# Patient Record
Sex: Male | Born: 1960 | Hispanic: No | Marital: Married | Smoking: Never smoker
Health system: Southern US, Community
[De-identification: ages and names within clinical notes are randomized; demographics above are authoritative.]

## PROBLEM LIST (undated history)

## (undated) DIAGNOSIS — Z87442 Personal history of urinary calculi: Secondary | ICD-10-CM

## (undated) DIAGNOSIS — K219 Gastro-esophageal reflux disease without esophagitis: Secondary | ICD-10-CM

## (undated) DIAGNOSIS — M1990 Unspecified osteoarthritis, unspecified site: Secondary | ICD-10-CM

## (undated) DIAGNOSIS — K5190 Ulcerative colitis, unspecified, without complications: Secondary | ICD-10-CM

## (undated) DIAGNOSIS — Z8719 Personal history of other diseases of the digestive system: Secondary | ICD-10-CM

## (undated) HISTORY — PX: APPENDECTOMY: SHX54

## (undated) HISTORY — PX: JOINT REPLACEMENT: SHX530

## (undated) HISTORY — PX: WISDOM TOOTH EXTRACTION: SHX21

## (undated) HISTORY — PX: OTHER SURGICAL HISTORY: SHX169

---

## 2019-08-02 NOTE — Patient Instructions (Addendum)
DUE TO COVID-19 ONLY ONE VISITOR IS ALLOWED TO COME WITH YOU AND STAY IN THE WAITING ROOM ONLY DURING PRE OP AND PROCEDURE DAY OF SURGERY. THE 1 VISITOR MAY VISIT WITH YOU AFTER SURGERY IN YOUR PRIVATE ROOM DURING VISITING HOURS ONLY!  YOU NEED TO HAVE A COVID 19 TEST ON_______ @_______ , THIS TEST MUST BE DONE BEFORE SURGERY, COME  [address], [place] , .  [facility]) ONCE YOUR COVID TEST IS COMPLETED, PLEASE BEGIN THE QUARANTINE INSTRUCTIONS AS OUTLINED IN YOUR HANDOUT.                Joe Moody  08/02/2019   Your procedure is scheduled on: 08-09-19   Report to [facility] Main  Entrance   Report to admitting at       0930  AM     Call this number if you have problems the morning of surgery [phone]    Remember: NO SOLID FOOD AFTER MIDNIGHT THE NIGHT PRIOR TO SURGERY. NOTHING BY MOUTH EXCEPT CLEAR LIQUIDS UNTIL    0900 am . PLEASE FINISH ENSURE DRINK PER SURGEON ORDER  WHICH NEEDS TO BE COMPLETED AT    0900 am then nothing by mouth .     BRUSH YOUR TEETH MORNING OF SURGERY AND RINSE YOUR MOUTH OUT, NO CHEWING GUM CANDY OR MINTS.     Take these medicines the morning of surgery with A SIP OF WATER: mesalamine                                 You may not have any metal on your body including hair pins and              piercings  Do not wear jewelry,  lotions, powders or perfumes, deodorant              Men may shave face and neck.   Do not bring valuables to the hospital. [facility] IS NOT             RESPONSIBLE   FOR VALUABLES.  Contacts, dentures or bridgework may not be worn into surgery.  .              Please read over the following fact sheets you were given: ____________________________________________________________________           [facility] - Preparing for Surgery Before surgery, you can play an important role.  Because skin is not sterile, your skin needs to be as free of germs as possible.  You can reduce the  number of germs on your skin by washing with CHG (chlorahexidine gluconate) soap before surgery.  CHG is an antiseptic cleaner which kills germs and bonds with the skin to continue killing germs even after washing. Please DO NOT use if you have an allergy to CHG or antibacterial soaps.  If your skin becomes reddened/irritated stop using the CHG and inform your nurse when you arrive at Short Stay. Do not shave (including legs and underarms) for at least 48 hours prior to the first CHG shower.  You may shave your face/neck. Please follow these instructions carefully:  1.  Shower with CHG Soap the night before surgery and the  morning of Surgery.  2.  If you choose to wash your hair, wash your hair first as usual with your  normal  shampoo.  3.  After you shampoo, rinse your hair and  body thoroughly to remove the  shampoo.                           4.  Use CHG as you would any other liquid soap.  You can apply chg directly  to the skin and wash                       Gently with a scrungie or clean washcloth.  5.  Apply the CHG Soap to your body ONLY FROM THE NECK DOWN.   Do not use on face/ open                           Wound or open sores. Avoid contact with eyes, ears mouth and genitals (private parts).                       Wash face,  Genitals (private parts) with your normal soap.             6.  Wash thoroughly, paying special attention to the area where your surgery  will be performed.  7.  Thoroughly rinse your body with warm water from the neck down.  8.  DO NOT shower/wash with your normal soap after using and rinsing off  the CHG Soap.                9.  Pat yourself dry with a clean towel.            10.  Wear clean pajamas.            11.  Place clean sheets on your bed the night of your first shower and do not  sleep with pets. Day of Surgery : Do not apply any lotions/deodorants the morning of surgery.  Please wear clean clothes to the hospital/surgery center.  FAILURE TO FOLLOW  THESE INSTRUCTIONS MAY RESULT IN THE CANCELLATION OF YOUR SURGERY PATIENT SIGNATURE_________________________________  NURSE SIGNATURE__________________________________  ________________________________________________________________________   [name]  An incentive spirometer is a tool that can help keep your lungs clear and active. This tool measures how well you are filling your lungs with each breath. Taking long deep breaths may help reverse or decrease the chance of developing breathing (pulmonary) problems (especially infection) following:  A long period of time when you are unable to move or be active. BEFORE THE PROCEDURE   If the spirometer includes an indicator to show your best effort, your nurse or respiratory therapist will set it to a desired goal.  If possible, sit up straight or lean slightly forward. Try not to slouch.  Hold the incentive spirometer in an upright position. INSTRUCTIONS FOR USE  1. Sit on the edge of your bed if possible, or sit up as far as you can in bed or on a chair. 2. Hold the incentive spirometer in an upright position. 3. Breathe out normally. 4. Place the mouthpiece in your mouth and seal your lips tightly around it. 5. Breathe in slowly and as deeply as possible, raising the piston or the ball toward the top of the column. 6. Hold your breath for 3-5 seconds or for as long as possible. Allow the piston or ball to fall to the bottom of the column. 7. Remove the mouthpiece from your mouth and breathe out normally. 8. Rest for a few seconds and repeat Steps 1 through  7 at least 10 times every 1-2 hours when you are awake. Take your time and take a few normal breaths between deep breaths. 9. The spirometer may include an indicator to show your best effort. Use the indicator as a goal to work toward during each repetition. 10. After each set of 10 deep breaths, practice coughing to be sure your lungs are clear. If you have an incision  (the cut made at the time of surgery), support your incision when coughing by placing a pillow or rolled up towels firmly against it. Once you are able to get out of bed, walk around indoors and cough well. You may stop using the incentive spirometer when instructed by your caregiver.  RISKS AND COMPLICATIONS  Take your time so you do not get dizzy or light-headed.  If you are in pain, you may need to take or ask for pain medication before doing incentive spirometry. It is harder to take a deep breath if you are having pain. AFTER USE  Rest and breathe slowly and easily.  It can be helpful to keep track of a log of your progress. Your caregiver can provide you with a simple table to help with this. If you are using the spirometer at home, follow these instructions: [place] IF:   You are having difficultly using the spirometer.  You have trouble using the spirometer as often as instructed.  Your pain medication is not giving enough relief while using the spirometer.  You develop fever of 100.5 F (38.1 C) or higher. SEEK IMMEDIATE MEDICAL CARE IF:   You cough up bloody sputum that had not been present before.  You develop fever of 102 F (38.9 C) or greater.  You develop worsening pain at or near the incision site. MAKE SURE YOU:   Understand these instructions.  Will watch your condition.  Will get help right away if you are not doing well or get worse. Document Released: 02/21/2007 Document Revised: 01/03/2012 Document Reviewed: 04/24/2007 ExitCare Patient Information 2014 ExitCare, [place].   ________________________________________________________________________  WHAT IS A BLOOD TRANSFUSION? Blood Transfusion Information  A transfusion is the replacement of blood or some of its parts. Blood is made up of multiple cells which provide different functions.  Red blood cells carry oxygen and are used for blood loss replacement.  White blood cells fight against  infection.  Platelets control bleeding.  Plasma helps clot blood.  Other blood products are available for specialized needs, such as hemophilia or other clotting disorders. BEFORE THE TRANSFUSION  Who gives blood for transfusions?   Healthy volunteers who are fully evaluated to make sure their blood is safe. This is blood bank blood. Transfusion therapy is the safest it has ever been in the practice of medicine. Before blood is taken from a donor, a complete history is taken to make sure that person has no history of diseases nor engages in risky social behavior (examples are intravenous drug use or sexual activity with multiple partners). The donor's travel history is screened to minimize risk of transmitting infections, such as malaria. The donated blood is tested for signs of infectious diseases, such as HIV and hepatitis. The blood is then tested to be sure it is compatible with you in order to minimize the chance of a transfusion reaction. If you or a relative donates blood, this is often done in anticipation of surgery and is not appropriate for emergency situations. It takes many days to process the donated blood. RISKS AND COMPLICATIONS  Although transfusion therapy is very safe and saves many lives, the main dangers of transfusion include:   Getting an infectious disease.  Developing a transfusion reaction. This is an allergic reaction to something in the blood you were given. Every precaution is taken to prevent this. The decision to have a blood transfusion has been considered carefully by your caregiver before blood is given. Blood is not given unless the benefits outweigh the risks. AFTER THE TRANSFUSION  Right after receiving a blood transfusion, you will usually feel much better and more energetic. This is especially true if your red blood cells have gotten low (anemic). The transfusion raises the level of the red blood cells which carry oxygen, and this usually causes an energy  increase.  The nurse administering the transfusion will monitor you carefully for complications. HOME CARE INSTRUCTIONS  No special instructions are needed after a transfusion. You may find your energy is better. Speak with your caregiver about any limitations on activity for underlying diseases you may have. SEEK MEDICAL CARE IF:   Your condition is not improving after your transfusion.  You develop redness or irritation at the intravenous (IV) site. SEEK IMMEDIATE MEDICAL CARE IF:  Any of the following symptoms occur over the next 12 hours:  Shaking chills.  You have a temperature by mouth above 102 F (38.9 C), not controlled by medicine.  Chest, back, or muscle pain.  People around you feel you are not acting correctly or are confused.  Shortness of breath or difficulty breathing.  Dizziness and fainting.  You get a rash or develop hives.  You have a decrease in urine output.  Your urine turns a dark color or changes to pink, red, or brown. Any of the following symptoms occur over the next 10 days:  You have a temperature by mouth above 102 F (38.9 C), not controlled by medicine.  Shortness of breath.  Weakness after normal activity.  The white part of the eye turns yellow (jaundice).  You have a decrease in the amount of urine or are urinating less often.  Your urine turns a dark color or changes to pink, red, or brown. Document Released: 10/08/2000 Document Revised: 01/03/2012 Document Reviewed: 05/27/2008 [facility] Patient Information 2014 [place], [place].  _______________________________________________________________________

## 2019-08-06 ENCOUNTER — Other Ambulatory Visit (HOSPITAL_COMMUNITY): Payer: BC Managed Care – PPO

## 2019-08-06 ENCOUNTER — Other Ambulatory Visit (HOSPITAL_COMMUNITY)
Admission: RE | Admit: 2019-08-06 | Discharge: 2019-08-06 | Disposition: A | Discharge status: Home / Self Care | Payer: BC Managed Care – PPO | Source: Ambulatory Visit | Attending: Orthopedic Surgery | Admitting: Orthopedic Surgery

## 2019-08-06 ENCOUNTER — Encounter (HOSPITAL_COMMUNITY): Payer: Self-pay

## 2019-08-06 ENCOUNTER — Other Ambulatory Visit: Payer: Self-pay

## 2019-08-06 ENCOUNTER — Encounter (INDEPENDENT_AMBULATORY_CARE_PROVIDER_SITE_OTHER): Payer: Self-pay

## 2019-08-06 ENCOUNTER — Encounter (HOSPITAL_COMMUNITY)
Admission: RE | Admit: 2019-08-06 | Discharge: 2019-08-06 | Disposition: A | Discharge status: Home / Self Care | Payer: BC Managed Care – PPO | Source: Ambulatory Visit | Attending: Orthopedic Surgery | Admitting: Orthopedic Surgery

## 2019-08-06 DIAGNOSIS — T84091A Other mechanical complication of internal left hip prosthesis, initial encounter: Secondary | ICD-10-CM | POA: Diagnosis not present

## 2019-08-06 DIAGNOSIS — Z20828 Contact with and (suspected) exposure to other viral communicable diseases: Secondary | ICD-10-CM | POA: Insufficient documentation

## 2019-08-06 DIAGNOSIS — Z01812 Encounter for preprocedural laboratory examination: Secondary | ICD-10-CM | POA: Diagnosis not present

## 2019-08-06 HISTORY — DX: Ulcerative colitis, unspecified, without complications: K51.90

## 2019-08-06 HISTORY — DX: Personal history of urinary calculi: Z87.442

## 2019-08-06 HISTORY — DX: Personal history of other diseases of the digestive system: Z87.19

## 2019-08-06 HISTORY — DX: Unspecified osteoarthritis, unspecified site: M19.90

## 2019-08-06 HISTORY — DX: Gastro-esophageal reflux disease without esophagitis: K21.9

## 2019-08-06 LAB — SURGICAL PCR SCREEN
MRSA, PCR: NEGATIVE
Staphylococcus aureus: NEGATIVE

## 2019-08-06 LAB — ABO/RH: ABO/RH(D): O POS

## 2019-08-06 NOTE — Progress Notes (Signed)
PCP - Laverta Baltimore PA-C Cardiologist -   Chest x-ray -  EKG - 07-31-19 EPIC Stress Test -  ECHO -  Cardiac Cath -  CBC/DIFF, CMP 07-31-19 ON CHART  Sleep Study -  CPAP -   Fasting Blood Sugar -  Checks Blood Sugar _____ times a day  Blood Thinner Instructions: Aspirin Instructions: Last Dose:  Anesthesia review:   Patient denies shortness of breath, fever, cough and chest pain at PAT appointment   Patient verbalized understanding of instructions that were given to them at the PAT appointment. Patient was also instructed that they will need to review over the PAT instructions again at home before surgery.

## 2019-08-06 NOTE — H&P (Signed)
TOTAL HIP REVISION ADMISSION H&P  Patient is admitted for left revision total hip arthroplasty.  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 male, has a history of pain and functional disability in the left hip due to dislocations and patient has failed non-surgical conservative treatments for greater than 12 weeks to include NSAID's and/or analgesics, use of assistive devices and activity modification. The indications for the revision total hip arthroplasty are instability with 3 dislocations.  Onset of symptoms was abrupt starting 3 years ago with stable course since that time.  Prior procedures on the left hip include arthroplasty and and subsquent revision.  Patient currently rates pain in the left hip at 10 out of 10 with dislocations, otherwise no significant pain.  Patient has evidence of previous TH by imaging studies.  This condition presents safety issues increasing the risk of falls.    There is no current active infection.  Risks, benefits and expectations were discussed with the patient.  Risks including but not limited to the risk of anesthesia, blood clots, nerve damage, blood vessel damage, failure of the prosthesis, infection and up to and including death.  Patient understand the risks, benefits and expectations and wishes to proceed with surgery.    PCP: Gweneth Fritter, MD  D/C Plans:       Home  Post-op Meds:       No Rx given   Tranexamic Acid:      To be given - IV   Decadron:      Is to be given  FYI:      ASA  Norco  DME:   Pt equipment Rx sent  PT:   HEP  Pharmacy: [facility], [place]   Past Medical History:  Diagnosis Date  . Arthritis   . GERD (gastroesophageal reflux disease)   . History of kidney stones   . Status post dilation of esophageal narrowing   . Ulcerative colitis [facility])     Past Surgical History:  Procedure Laterality Date  . APPENDECTOMY    . arthroscopic knees     bil    . JOINT REPLACEMENT     bil hips  . WISDOM TOOTH EXTRACTION      No current facility-administered medications for this encounter.    Current Outpatient Medications  Medication Sig Dispense Refill Last Dose  . celecoxib (CELEBREX) 200 MG capsule Take 200 mg by mouth every evening.     Marland Kitchen esomeprazole (NEXIUM) 40 MG capsule Take 40 mg by mouth every evening.     . finasteride (PROSCAR) 5 MG tablet Take 5 mg by mouth every evening.     . mesalamine (LIALDA) 1.2 g EC tablet Take 2.4 g by mouth daily with breakfast.     . Probiotic Product (ALIGN) 4 MG CAPS Take 1 capsule by mouth daily.      No Known Allergies  Social History   Tobacco Use  . Smoking status: Never Smoker  . Smokeless tobacco: Never Used  Substance Use Topics  . Alcohol use: Not Currently       Review of Systems  Constitutional: Negative.   HENT: Negative.   Eyes: Negative.   Respiratory: Negative.   Cardiovascular: Negative.   Gastrointestinal: Positive for heartburn.  Genitourinary: Negative.   Musculoskeletal: Positive for joint pain.  Skin: Negative.   Neurological: Negative.   Endo/Heme/Allergies: Negative.   Psychiatric/Behavioral: Negative.     Objective:  Physical Exam  Constitutional:  He is oriented to person, place, and time. He appears well-developed.  HENT:  Head: Normocephalic.  Eyes: Pupils are equal, round, and reactive to light.  Neck: Neck supple. No JVD present. No tracheal deviation present. No thyromegaly present.  Cardiovascular: Normal rate, regular rhythm and intact distal pulses.  Respiratory: Effort normal and breath sounds normal. No respiratory distress. He has no wheezes.  GI: Soft. There is no abdominal tenderness. There is no guarding.  Musculoskeletal:     Left hip: He exhibits tenderness. He exhibits no laceration (healed previous incision).  Lymphadenopathy:    He has no cervical adenopathy.  Neurological: He is alert and oriented to person, place, and time. A  sensory deficit (tingling LEs (back)) is present.  Skin: Skin is warm and dry.  Psychiatric: He has a normal mood and affect.    Vital signs in last 24 hours: Temp:  [98.3 F (36.8 C)] 98.3 F (36.8 C) (10/12 1307) Pulse Rate:  [57] 57 (10/12 1307) Resp:  [18] 18 (10/12 1307) BP: (129)/(74) 129/74 (10/12 1307) SpO2:  [100 %] 100 % (10/12 1307) Weight:  [81 kg] 81 kg (10/12 1307)   Labs:   Estimated body mass index is 24.9 kg/m as calculated from the following:   Height as of 08/06/19: 5\' 11"  (1.803 m).   Weight as of 08/06/19: 81 kg.  Imaging Review:  Plain radiographs demonstrate THA of the left hip. The bone quality appears to be good for age and reported activity level.    Assessment/Plan:  Left hip with failed previous arthroplasty.  The patient history, physical examination, clinical judgement of the provider and imaging studies are consistent with failure of the left hip, previous total hip arthroplasty. Revision total hip arthroplasty is deemed medically necessary. The treatment options including medical management, injection therapy, arthroscopy and arthroplasty were discussed at length. The risks and benefits of total hip arthroplasty were presented and reviewed. The risks due to aseptic loosening, infection, stiffness, dislocation/subluxation,  thromboembolic complications and other imponderables were discussed.  The patient acknowledged the explanation, agreed to proceed with the plan and consent was signed. Patient is being admitted for inpatient treatment for surgery, pain control, PT, OT, prophylactic antibiotics, VTE prophylaxis, progressive ambulation and ADL's and discharge planning. The patient is planning to be discharged home.    10/06/19 Jaylaa Gallion   PA-C  08/06/2019, 8:34 PM

## 2019-08-07 LAB — SARS CORONAVIRUS 2 (TAT 6-24 HRS): SARS Coronavirus 2: NEGATIVE

## 2019-08-09 ENCOUNTER — Encounter (HOSPITAL_COMMUNITY): Payer: Self-pay | Admitting: Registered Nurse

## 2019-08-09 ENCOUNTER — Inpatient Hospital Stay (HOSPITAL_COMMUNITY)
Admission: RE | Admit: 2019-08-09 | Discharge: 2019-08-10 | DRG: 468 | Disposition: A | Discharge status: Home / Self Care | Payer: BC Managed Care – PPO | Attending: Orthopedic Surgery | Admitting: Orthopedic Surgery

## 2019-08-09 ENCOUNTER — Inpatient Hospital Stay (HOSPITAL_COMMUNITY): Payer: BC Managed Care – PPO | Admitting: Registered Nurse

## 2019-08-09 ENCOUNTER — Encounter (HOSPITAL_COMMUNITY)
Admission: RE | Disposition: A | Discharge status: Home / Self Care | Payer: Self-pay | Source: Home / Self Care | Attending: Orthopedic Surgery

## 2019-08-09 ENCOUNTER — Inpatient Hospital Stay (HOSPITAL_COMMUNITY): Payer: BC Managed Care – PPO

## 2019-08-09 ENCOUNTER — Other Ambulatory Visit: Payer: Self-pay

## 2019-08-09 ENCOUNTER — Inpatient Hospital Stay (HOSPITAL_COMMUNITY): Payer: BC Managed Care – PPO | Admitting: Physician Assistant

## 2019-08-09 DIAGNOSIS — Z96641 Presence of right artificial hip joint: Secondary | ICD-10-CM | POA: Diagnosis present

## 2019-08-09 DIAGNOSIS — T84021A Dislocation of internal left hip prosthesis, initial encounter: Secondary | ICD-10-CM | POA: Diagnosis present

## 2019-08-09 DIAGNOSIS — K219 Gastro-esophageal reflux disease without esophagitis: Secondary | ICD-10-CM | POA: Diagnosis present

## 2019-08-09 DIAGNOSIS — Z79899 Other long term (current) drug therapy: Secondary | ICD-10-CM

## 2019-08-09 DIAGNOSIS — Z87442 Personal history of urinary calculi: Secondary | ICD-10-CM

## 2019-08-09 DIAGNOSIS — M25352 Other instability, left hip: Secondary | ICD-10-CM | POA: Diagnosis present

## 2019-08-09 DIAGNOSIS — Z96649 Presence of unspecified artificial hip joint: Secondary | ICD-10-CM

## 2019-08-09 DIAGNOSIS — M1990 Unspecified osteoarthritis, unspecified site: Secondary | ICD-10-CM | POA: Diagnosis present

## 2019-08-09 HISTORY — PX: TOTAL HIP REVISION: SHX763

## 2019-08-09 LAB — TYPE AND SCREEN
ABO/RH(D): O POS
Antibody Screen: NEGATIVE

## 2019-08-09 SURGERY — TOTAL HIP REVISION
Anesthesia: Spinal | Site: Hip | Laterality: Left

## 2019-08-09 MED ORDER — ONDANSETRON HCL 4 MG/2ML IJ SOLN: 4.0000 mg | Freq: Four times a day (QID) | INTRAMUSCULAR | Status: DC | PRN

## 2019-08-09 MED ORDER — MENTHOL 3 MG MT LOZG: 1.0000 | LOZENGE | OROMUCOSAL | Status: DC | PRN

## 2019-08-09 MED ORDER — FINASTERIDE 5 MG PO TABS
5.0000 mg | ORAL_TABLET | Freq: Every evening | ORAL | Status: DC
  Administered 2019-08-09: 5 mg via ORAL
  Filled 2019-08-09: qty 1

## 2019-08-09 MED ORDER — MIDAZOLAM HCL 5 MG/5ML IJ SOLN
INTRAMUSCULAR | Status: DC | PRN
  Administered 2019-08-09 (×2): 1 mg via INTRAVENOUS

## 2019-08-09 MED ORDER — CEFAZOLIN SODIUM-DEXTROSE 2-4 GM/100ML-% IV SOLN
2.0000 g | INTRAVENOUS | Status: AC
  Administered 2019-08-09: 12:00:00 2 g via INTRAVENOUS
  Filled 2019-08-09: qty 100

## 2019-08-09 MED ORDER — OXYCODONE HCL 5 MG PO TABS: 5.0000 mg | ORAL_TABLET | Freq: Once | ORAL | Status: DC | PRN

## 2019-08-09 MED ORDER — MAGNESIUM CITRATE PO SOLN: 1.0000 | Freq: Once | ORAL | Status: DC | PRN

## 2019-08-09 MED ORDER — FENTANYL CITRATE (PF) 100 MCG/2ML IJ SOLN
INTRAMUSCULAR | Status: DC | PRN
  Administered 2019-08-09 (×2): 50 ug via INTRAVENOUS

## 2019-08-09 MED ORDER — ACETAMINOPHEN 325 MG PO TABS: 325.0000 mg | ORAL_TABLET | Freq: Four times a day (QID) | ORAL | Status: DC | PRN

## 2019-08-09 MED ORDER — TRANEXAMIC ACID-NACL 1000-0.7 MG/100ML-% IV SOLN
1000.0000 mg | Freq: Once | INTRAVENOUS | Status: AC
  Administered 2019-08-09: 1000 mg via INTRAVENOUS
  Filled 2019-08-09: qty 100

## 2019-08-09 MED ORDER — PROPOFOL 10 MG/ML IV BOLUS
INTRAVENOUS | Status: DC | PRN
  Administered 2019-08-09: 30 mg via INTRAVENOUS

## 2019-08-09 MED ORDER — SODIUM CHLORIDE 0.9 % IV SOLN
INTRAVENOUS | Status: DC
  Administered 2019-08-09: 17:00:00 via INTRAVENOUS

## 2019-08-09 MED ORDER — PROMETHAZINE HCL 25 MG/ML IJ SOLN: 6.2500 mg | INTRAMUSCULAR | Status: DC | PRN

## 2019-08-09 MED ORDER — ALBUMIN HUMAN 5 % IV SOLN
INTRAVENOUS | Status: DC | PRN
  Administered 2019-08-09 (×2): via INTRAVENOUS

## 2019-08-09 MED ORDER — ALBUMIN HUMAN 5 % IV SOLN
INTRAVENOUS | Status: AC
  Filled 2019-08-09: qty 500

## 2019-08-09 MED ORDER — ALUM & MAG HYDROXIDE-SIMETH 200-200-20 MG/5ML PO SUSP: 15.0000 mL | ORAL | Status: DC | PRN

## 2019-08-09 MED ORDER — DIPHENHYDRAMINE HCL 12.5 MG/5ML PO ELIX: 12.5000 mg | ORAL_SOLUTION | ORAL | Status: DC | PRN

## 2019-08-09 MED ORDER — CHLORHEXIDINE GLUCONATE 4 % EX LIQD: 60.0000 mL | Freq: Once | CUTANEOUS | Status: DC

## 2019-08-09 MED ORDER — DOCUSATE SODIUM 100 MG PO CAPS
100.0000 mg | ORAL_CAPSULE | Freq: Two times a day (BID) | ORAL | Status: DC
  Administered 2019-08-09 – 2019-08-10 (×2): 100 mg via ORAL
  Filled 2019-08-09 (×2): qty 1

## 2019-08-09 MED ORDER — ONDANSETRON HCL 4 MG PO TABS: 4.0000 mg | ORAL_TABLET | Freq: Four times a day (QID) | ORAL | Status: DC | PRN

## 2019-08-09 MED ORDER — METHOCARBAMOL 500 MG IVPB - SIMPLE MED
500.0000 mg | Freq: Four times a day (QID) | INTRAVENOUS | Status: DC | PRN
  Administered 2019-08-09: 500 mg via INTRAVENOUS
  Filled 2019-08-09: qty 50

## 2019-08-09 MED ORDER — CEFAZOLIN SODIUM-DEXTROSE 2-4 GM/100ML-% IV SOLN
2.0000 g | Freq: Four times a day (QID) | INTRAVENOUS | Status: AC
  Administered 2019-08-09 (×2): 2 g via INTRAVENOUS
  Filled 2019-08-09 (×2): qty 100

## 2019-08-09 MED ORDER — MIDAZOLAM HCL 2 MG/2ML IJ SOLN
INTRAMUSCULAR | Status: AC
  Filled 2019-08-09: qty 2

## 2019-08-09 MED ORDER — HYDROCODONE-ACETAMINOPHEN 7.5-325 MG PO TABS: 1.0000 | ORAL_TABLET | ORAL | Status: DC | PRN

## 2019-08-09 MED ORDER — FENTANYL CITRATE (PF) 100 MCG/2ML IJ SOLN
INTRAMUSCULAR | Status: AC
  Filled 2019-08-09: qty 2

## 2019-08-09 MED ORDER — DEXAMETHASONE SODIUM PHOSPHATE 10 MG/ML IJ SOLN
INTRAMUSCULAR | Status: AC
  Filled 2019-08-09: qty 1

## 2019-08-09 MED ORDER — TRANEXAMIC ACID-NACL 1000-0.7 MG/100ML-% IV SOLN
1000.0000 mg | INTRAVENOUS | Status: AC
  Administered 2019-08-09: 12:00:00 1000 mg via INTRAVENOUS
  Filled 2019-08-09: qty 100

## 2019-08-09 MED ORDER — HYDROMORPHONE HCL 1 MG/ML IJ SOLN
0.2500 mg | INTRAMUSCULAR | Status: DC | PRN
  Administered 2019-08-09 (×3): 0.5 mg via INTRAVENOUS

## 2019-08-09 MED ORDER — PHENYLEPHRINE 40 MCG/ML (10ML) SYRINGE FOR IV PUSH (FOR BLOOD PRESSURE SUPPORT)
PREFILLED_SYRINGE | INTRAVENOUS | Status: DC | PRN
  Administered 2019-08-09: 80 ug via INTRAVENOUS

## 2019-08-09 MED ORDER — DEXAMETHASONE SODIUM PHOSPHATE 10 MG/ML IJ SOLN
10.0000 mg | Freq: Once | INTRAMUSCULAR | Status: AC
  Administered 2019-08-09: 10 mg via INTRAVENOUS

## 2019-08-09 MED ORDER — SODIUM CHLORIDE 0.9 % IR SOLN
Status: DC | PRN
  Administered 2019-08-09: 1000 mL

## 2019-08-09 MED ORDER — HYDROMORPHONE HCL 1 MG/ML IJ SOLN
0.5000 mg | INTRAMUSCULAR | Status: DC | PRN
  Administered 2019-08-09: 1 mg via INTRAVENOUS
  Filled 2019-08-09: qty 1

## 2019-08-09 MED ORDER — METOCLOPRAMIDE HCL 5 MG/ML IJ SOLN: 5.0000 mg | Freq: Three times a day (TID) | INTRAMUSCULAR | Status: DC | PRN

## 2019-08-09 MED ORDER — HYDROMORPHONE HCL 1 MG/ML IJ SOLN
INTRAMUSCULAR | Status: AC
  Filled 2019-08-09: qty 1

## 2019-08-09 MED ORDER — ESOMEPRAZOLE MAGNESIUM 20 MG PO CPDR
40.0000 mg | DELAYED_RELEASE_CAPSULE | Freq: Every day | ORAL | Status: DC
  Administered 2019-08-09: 40 mg via ORAL
  Filled 2019-08-09: qty 2

## 2019-08-09 MED ORDER — PHENYLEPHRINE 40 MCG/ML (10ML) SYRINGE FOR IV PUSH (FOR BLOOD PRESSURE SUPPORT)
PREFILLED_SYRINGE | INTRAVENOUS | Status: AC
  Filled 2019-08-09: qty 10

## 2019-08-09 MED ORDER — CELECOXIB 200 MG PO CAPS
200.0000 mg | ORAL_CAPSULE | Freq: Two times a day (BID) | ORAL | Status: DC
  Administered 2019-08-09 – 2019-08-10 (×2): 200 mg via ORAL
  Filled 2019-08-09 (×2): qty 1

## 2019-08-09 MED ORDER — FERROUS SULFATE 325 (65 FE) MG PO TABS
325.0000 mg | ORAL_TABLET | Freq: Three times a day (TID) | ORAL | Status: DC
  Administered 2019-08-09 – 2019-08-10 (×2): 325 mg via ORAL
  Filled 2019-08-09 (×2): qty 1

## 2019-08-09 MED ORDER — ONDANSETRON HCL 4 MG/2ML IJ SOLN
INTRAMUSCULAR | Status: DC | PRN
  Administered 2019-08-09: 4 mg via INTRAVENOUS

## 2019-08-09 MED ORDER — PHENOL 1.4 % MT LIQD: 1.0000 | OROMUCOSAL | Status: DC | PRN

## 2019-08-09 MED ORDER — METHOCARBAMOL 500 MG PO TABS
500.0000 mg | ORAL_TABLET | Freq: Four times a day (QID) | ORAL | Status: DC | PRN
  Filled 2019-08-09: qty 1

## 2019-08-09 MED ORDER — METOCLOPRAMIDE HCL 5 MG PO TABS: 5.0000 mg | ORAL_TABLET | Freq: Three times a day (TID) | ORAL | Status: DC | PRN

## 2019-08-09 MED ORDER — OXYCODONE HCL 5 MG/5ML PO SOLN: 5.0000 mg | Freq: Once | ORAL | Status: DC | PRN

## 2019-08-09 MED ORDER — HYDROCODONE-ACETAMINOPHEN 5-325 MG PO TABS
1.0000 | ORAL_TABLET | ORAL | Status: DC | PRN
  Administered 2019-08-09 – 2019-08-10 (×4): 2 via ORAL
  Filled 2019-08-09 (×4): qty 2

## 2019-08-09 MED ORDER — LIDOCAINE 2% (20 MG/ML) 5 ML SYRINGE
INTRAMUSCULAR | Status: AC
  Filled 2019-08-09: qty 5

## 2019-08-09 MED ORDER — PROPOFOL 10 MG/ML IV BOLUS
INTRAVENOUS | Status: AC
  Filled 2019-08-09: qty 20

## 2019-08-09 MED ORDER — LACTATED RINGERS IV SOLN
INTRAVENOUS | Status: DC
  Administered 2019-08-09 (×3): via INTRAVENOUS

## 2019-08-09 MED ORDER — ONDANSETRON HCL 4 MG/2ML IJ SOLN
INTRAMUSCULAR | Status: AC
  Filled 2019-08-09: qty 2

## 2019-08-09 MED ORDER — ASPIRIN 81 MG PO CHEW
81.0000 mg | CHEWABLE_TABLET | Freq: Two times a day (BID) | ORAL | Status: DC
  Administered 2019-08-09 – 2019-08-10 (×2): 81 mg via ORAL
  Filled 2019-08-09 (×2): qty 1

## 2019-08-09 MED ORDER — BUPIVACAINE IN DEXTROSE 0.75-8.25 % IT SOLN
INTRATHECAL | Status: DC | PRN
  Administered 2019-08-09: 2 mL via INTRATHECAL

## 2019-08-09 MED ORDER — BISACODYL 10 MG RE SUPP: 10.0000 mg | Freq: Every day | RECTAL | Status: DC | PRN

## 2019-08-09 MED ORDER — LIDOCAINE 2% (20 MG/ML) 5 ML SYRINGE
INTRAMUSCULAR | Status: DC | PRN
  Administered 2019-08-09: 40 mg via INTRAVENOUS

## 2019-08-09 MED ORDER — METHOCARBAMOL 500 MG IVPB - SIMPLE MED
INTRAVENOUS | Status: AC
  Filled 2019-08-09: qty 50

## 2019-08-09 MED ORDER — DEXAMETHASONE SODIUM PHOSPHATE 10 MG/ML IJ SOLN: 10.0000 mg | Freq: Once | INTRAMUSCULAR | Status: DC

## 2019-08-09 MED ORDER — POLYETHYLENE GLYCOL 3350 17 G PO PACK
17.0000 g | PACK | Freq: Two times a day (BID) | ORAL | Status: DC
  Administered 2019-08-09 – 2019-08-10 (×2): 17 g via ORAL
  Filled 2019-08-09 (×2): qty 1

## 2019-08-09 MED ORDER — PROPOFOL 500 MG/50ML IV EMUL
INTRAVENOUS | Status: DC | PRN
  Administered 2019-08-09: 75 ug/kg/min via INTRAVENOUS

## 2019-08-09 SURGICAL SUPPLY — 69 items
BAG DECANTER FOR FLEXI CONT (MISCELLANEOUS) ×3 IMPLANT
BAG ZIPLOCK 12X15 (MISCELLANEOUS) ×3 IMPLANT
BLADE SAW SGTL 11.0X1.19X90.0M (BLADE) IMPLANT
BLADE SAW SGTL 18X1.27X75 (BLADE) ×2 IMPLANT
BLADE SAW SGTL 18X1.27X75MM (BLADE) ×1
BRUSH FEMORAL CANAL (MISCELLANEOUS) IMPLANT
COVER SURGICAL LIGHT HANDLE (MISCELLANEOUS) ×3 IMPLANT
COVER WAND RF STERILE (DRAPES) IMPLANT
CUP ACET PINNCLE SECTR II 60MM (Hips) ×1 IMPLANT
DERMABOND ADVANCED (GAUZE/BANDAGES/DRESSINGS) ×4
DERMABOND ADVANCED .7 DNX12 (GAUZE/BANDAGES/DRESSINGS) ×2 IMPLANT
DRAPE ORTHO SPLIT 77X108 STRL (DRAPES) ×4
DRAPE POUCH INSTRU U-SHP 10X18 (DRAPES) ×3 IMPLANT
DRAPE SURG 17X11 SM STRL (DRAPES) ×3 IMPLANT
DRAPE SURG ORHT 6 SPLT 77X108 (DRAPES) ×2 IMPLANT
DRAPE U-SHAPE 47X51 STRL (DRAPES) ×3 IMPLANT
DRESSING AQUACEL AG SP 3.5X10 (GAUZE/BANDAGES/DRESSINGS) IMPLANT
DRSG AQUACEL AG ADV 3.5X10 (GAUZE/BANDAGES/DRESSINGS) IMPLANT
DRSG AQUACEL AG ADV 3.5X14 (GAUZE/BANDAGES/DRESSINGS) ×3 IMPLANT
DRSG AQUACEL AG SP 3.5X10 (GAUZE/BANDAGES/DRESSINGS)
DURAPREP 26ML APPLICATOR (WOUND CARE) ×3 IMPLANT
ELECT BLADE TIP CTD 4 INCH (ELECTRODE) ×3 IMPLANT
ELECT REM PT RETURN 15FT ADLT (MISCELLANEOUS) ×3 IMPLANT
ELIMINATOR HOLE APEX DEPUY (Hips) ×3 IMPLANT
FACESHIELD WRAPAROUND (MASK) ×12 IMPLANT
GAUZE SPONGE 2X2 8PLY STRL LF (GAUZE/BANDAGES/DRESSINGS) ×1 IMPLANT
GLOVE BIOGEL M 7.0 STRL (GLOVE) IMPLANT
GLOVE BIOGEL PI IND STRL 7.5 (GLOVE) ×1 IMPLANT
GLOVE BIOGEL PI IND STRL 8.5 (GLOVE) ×1 IMPLANT
GLOVE BIOGEL PI INDICATOR 7.5 (GLOVE) ×2
GLOVE BIOGEL PI INDICATOR 8.5 (GLOVE) ×2
GLOVE ECLIPSE 8.0 STRL XLNG CF (GLOVE) ×6 IMPLANT
GOWN STRL REUS W/TWL 2XL LVL3 (GOWN DISPOSABLE) ×3 IMPLANT
GOWN STRL REUS W/TWL LRG LVL3 (GOWN DISPOSABLE) ×3 IMPLANT
HANDPIECE INTERPULSE COAX TIP (DISPOSABLE)
HEAD FEMUR METAL 36MM 15.6 HIP (Head) ×1 IMPLANT
KIT TURNOVER KIT A (KITS) IMPLANT
LINER NEUTRAL 58X36MM PLUS4 ×3 IMPLANT
MANIFOLD NEPTUNE II (INSTRUMENTS) ×3 IMPLANT
MARKER SKIN DUAL TIP RULER LAB (MISCELLANEOUS) ×3 IMPLANT
METAL FEMUR HEAD 36MM 15.6 HIP (Head) ×3 IMPLANT
NDL SAFETY ECLIPSE 18X1.5 (NEEDLE) ×1 IMPLANT
NEEDLE HYPO 18GX1.5 SHARP (NEEDLE) ×2
NS IRRIG 1000ML POUR BTL (IV SOLUTION) ×6 IMPLANT
PACK TOTAL JOINT (CUSTOM PROCEDURE TRAY) ×3 IMPLANT
PADDING CAST COTTON 6X4 STRL (CAST SUPPLIES) ×3 IMPLANT
PINNACLE SECTOR II CUP 60MM (Hips) ×3 IMPLANT
PRESSURIZER FEMORAL UNIV (MISCELLANEOUS) IMPLANT
PROTECTOR NERVE ULNAR (MISCELLANEOUS) ×3 IMPLANT
SCREW 6.5MMX30MM (Screw) ×3 IMPLANT
SCREW 6.5MMX35MM (Screw) ×3 IMPLANT
SET HNDPC FAN SPRY TIP SCT (DISPOSABLE) IMPLANT
SPONGE GAUZE 2X2 STER 10/PKG (GAUZE/BANDAGES/DRESSINGS) ×2
SPONGE LAP 18X18 RF (DISPOSABLE) ×3 IMPLANT
SPONGE LAP 4X18 RFD (DISPOSABLE) ×3 IMPLANT
STAPLER VISISTAT 35W (STAPLE) ×3 IMPLANT
SUCTION FRAZIER HANDLE 10FR (MISCELLANEOUS) ×2
SUCTION TUBE FRAZIER 10FR DISP (MISCELLANEOUS) ×1 IMPLANT
SUT MNCRL AB 3-0 PS2 18 (SUTURE) ×3 IMPLANT
SUT STRATAFIX PDS+ 0 24IN (SUTURE) ×3 IMPLANT
SUT VIC AB 1 CT1 36 (SUTURE) ×3 IMPLANT
SUT VIC AB 2-0 CT1 27 (SUTURE) ×6
SUT VIC AB 2-0 CT1 TAPERPNT 27 (SUTURE) ×3 IMPLANT
TOWEL OR 17X26 10 PK STRL BLUE (TOWEL DISPOSABLE) ×6 IMPLANT
TOWER CARTRIDGE SMART MIX (DISPOSABLE) IMPLANT
TRAY FOLEY MTR SLVR 16FR STAT (SET/KITS/TRAYS/PACK) ×3 IMPLANT
TUBE KAMVAC SUCTION (TUBING) IMPLANT
WATER STERILE IRR 1000ML POUR (IV SOLUTION) ×3 IMPLANT
YANKAUER SUCT BULB TIP 10FT TU (MISCELLANEOUS) ×3 IMPLANT

## 2019-08-09 NOTE — Anesthesia Preprocedure Evaluation (Signed)
Anesthesia Evaluation  Patient identified by MRN, date of birth, ID band Patient awake    Reviewed: Allergy & Precautions, NPO status , Patient's Chart, lab work & pertinent test results  Airway Mallampati: II  TM Distance: >3 FB Neck ROM: Full    Dental no notable dental hx.    Pulmonary neg pulmonary ROS,    Pulmonary exam normal breath sounds clear to auscultation       Cardiovascular negative cardio ROS Normal cardiovascular exam Rhythm:Regular Rate:Normal     Neuro/Psych negative neurological ROS  negative psych ROS   GI/Hepatic Neg liver ROS, GERD  ,  Endo/Other  negative endocrine ROS  Renal/GU negative Renal ROS  negative genitourinary   Musculoskeletal  (+) Arthritis , Osteoarthritis,    Abdominal   Peds negative pediatric ROS (+)  Hematology negative hematology ROS (+)   Anesthesia Other Findings   Reproductive/Obstetrics negative OB ROS                             Anesthesia Physical Anesthesia Plan  ASA: II  Anesthesia Plan: Spinal   Post-op Pain Management:    Induction: Intravenous  PONV Risk Score and Plan: 1 and Ondansetron and Treatment may vary due to age or medical condition  Airway Management Planned: Simple Face Mask  Additional Equipment:   Intra-op Plan:   Post-operative Plan:   Informed Consent: I have reviewed the patients History and Physical, chart, labs and discussed the procedure including the risks, benefits and alternatives for the proposed anesthesia with the patient or authorized representative who has indicated his/her understanding and acceptance.     Dental advisory given  Plan Discussed with: CRNA  Anesthesia Plan Comments:         Anesthesia Quick Evaluation

## 2019-08-09 NOTE — Discharge Instructions (Signed)

## 2019-08-09 NOTE — Transfer of Care (Signed)
Immediate Anesthesia Transfer of Care Note  Patient: Joe Moody  Procedure(s) Performed: TOTAL HIP REVISION (Left Hip)  Patient Location: PACU  Anesthesia Type:Spinal  Level of Consciousness: sedated  Airway & Oxygen Therapy: Patient Spontanous Breathing and Patient connected to face mask oxygen  Post-op Assessment: Report given to RN and Post -op Vital signs reviewed and stable  Post vital signs: Reviewed and stable  Last Vitals:  Vitals Value Taken Time  BP 103/70 08/09/19 1408  Temp    Pulse 60 08/09/19 1409  Resp 10 08/09/19 1409  SpO2 100 % 08/09/19 1409  Vitals shown include unvalidated device data.  Last Pain:  Vitals:   08/09/19 0944  TempSrc: Oral      Patients Stated Pain Goal: 4 (29/24/46 2863)  Complications: No apparent anesthesia complications

## 2019-08-09 NOTE — Anesthesia Procedure Notes (Signed)
Spinal  Patient location during procedure: OR Start time: 08/09/2019 11:59 AM End time: 08/09/2019 12:05 PM Staffing Anesthesiologist: Lynda Rainwater, MD Resident/CRNA: Talbot Grumbling, CRNA Preanesthetic Checklist Completed: patient identified, surgical consent, pre-op evaluation, timeout performed, IV checked, risks and benefits discussed and monitors and equipment checked Spinal Block Patient position: sitting Prep: DuraPrep Patient monitoring: heart rate, cardiac monitor, continuous pulse ox and blood pressure Approach: midline Location: L3-4 Injection technique: single-shot Needle Needle type: Pencan  Needle gauge: 24 G Needle length: 9 cm Assessment Sensory level: T4 Additional Notes Clear CSF, no paresthesia, patient tolerated well.

## 2019-08-09 NOTE — Anesthesia Procedure Notes (Signed)
Date/Time: 08/09/2019 12:09 PM Performed by: Talbot Grumbling, CRNA Oxygen Delivery Method: Simple face mask

## 2019-08-09 NOTE — Interval H&P Note (Signed)
History and Physical Interval Note:  08/09/2019 10:25 AM  Joe Moody  has presented today for surgery, with the diagnosis of Left total hip instability.  The various methods of treatment have been discussed with the patient and family. After consideration of risks, benefits and other options for treatment, the patient has consented to  Procedure(s) with comments: TOTAL HIP REVISION (Left) - 90 mins as a surgical intervention.  The patient's history has been reviewed, patient examined, no change in status, stable for surgery.  I have reviewed the patient's chart and labs.  Questions were answered to the patient's satisfaction.     Mauri Pole

## 2019-08-09 NOTE — Evaluation (Signed)
Physical Therapy Evaluation Patient Details Name: Joe Moody MRN: [id number] DOB: Dec 17, 1960 Today's Date: 08/09/2019   History of Present Illness  Patient is 58 y.o. male s/p Lt THR (revision) on 08/09/19 with PMH significnat for bil GERD and OA with bil hip replacements and multiple revisions on Lt.    Clinical Impression  Joe Moody is a 58 y.o. male POD 0 s/p Lt THR . Patient reports independence with mobility at baseline. Patient is now limited by functional impairments (see PT problem list below) and requires min gurad for transfers and gait with RW. Patient was able to ambulate ~120 feet with RW and demonstrated good safety awareness. Patient instructed in exercise to facilitate circulation to prevent secondary medical complications. Extensive time spent educating patient on benefits of OPPT and typical course of rehab for THA. Discussed importance of graded exercises with one-on-one therapy sessions to gradually and safely progress mobility towards LTG of independent exercise routine within limits of his surgeons precautions/restrictions. Patient will benefit from continued skilled PT interventions to address impairments and progress towards PLOF. Acute PT will follow to progress mobility and stair training in preparation for safe discharge home.     Follow Up Recommendations Outpatient PT;Follow surgeon's recommendation for DC plan and follow-up therapies    Equipment Recommendations  None recommended by PT    Recommendations for Other Services       Precautions / Restrictions Precautions Precautions: Fall;Posterior Hip Restrictions Weight Bearing Restrictions: No      Mobility  Bed Mobility Overal bed mobility: Needs Assistance Bed Mobility: Supine to Sit     Supine to sit: Supervision;HOB elevated     General bed mobility comments: pt maintained hip precautions and no cues or assistance required  Transfers Overall transfer level: Needs assistance Equipment  used: Rolling walker (2 wheeled) Transfers: Sit to/from Stand Sit to Stand: From elevated surface;Min guard         General transfer comment: cues for safe hand placement with RW and safe technique, pt steady with rising, guard for safety  Ambulation/Gait Ambulation/Gait assistance: Min guard Gait Distance (Feet): 120 Feet Assistive device: Rolling walker (2 wheeled) Gait Pattern/deviations: Step-through pattern;Decreased step length - left;Decreased stance time - left;Decreased stride length Gait velocity: slighty decreased   General Gait Details: pt with good safety awareness throughout and no overt LOB noted, verbal cues to ensure small steps and educate pt not to pivot on Lt LE during turns  Science writer    Modified Rankin (Stroke Patients Only)       Balance Overall balance assessment: Mild deficits observed, not formally tested                  Pertinent Vitals/Pain Pain Assessment: 0-10 Pain Score: 2  Pain Location: Lt hip Pain Descriptors / Indicators: Aching;Sore Pain Intervention(s): Monitored during session;Limited activity within patient's tolerance;Repositioned    Home Living Family/patient expects to be discharged to:: Private residence Living Arrangements: Spouse/significant other Available Help at Discharge: Family Type of Home: House Home Access: Stairs to enter Entrance Stairs-Rails: None Entrance Stairs-Number of Steps: 3 Home Layout: One level Home Equipment: Walker - 2 wheels;Crutches;Toilet riser;Shower seat - built in;Grab bars - tub/shower      Prior Function Level of Independence: Independent               Hand Dominance   Dominant Hand: Right    Extremity/Trunk Assessment   Upper Extremity Assessment Upper  Extremity Assessment: Overall WFL for tasks assessed    Lower Extremity Assessment Lower Extremity Assessment: Overall WFL for tasks assessed    Cervical / Trunk  Assessment Cervical / Trunk Assessment: Normal  Communication   Communication: HOH  Cognition Arousal/Alertness: Awake/alert Behavior During Therapy: WFL for tasks assessed/performed Overall Cognitive Status: Within Functional Limits for tasks assessed                   General Comments      Exercises Total Joint Exercises Ankle Circles/Pumps: AROM;10 reps;Seated;Both   Assessment/Plan    PT Assessment Patient needs continued PT services  PT Problem List Decreased strength;Decreased range of motion;Decreased mobility;Decreased activity tolerance       PT Treatment Interventions DME instruction;Functional mobility training;Balance training;Patient/family education;Modalities;Therapeutic activities;Gait training;Stair training;Therapeutic exercise    PT Goals (Current goals can be found in the Care Plan section)  Acute Rehab PT Goals Patient Stated Goal: to get back to exercising with machines at home, to not have hip dislocate again PT Goal Formulation: With patient Time For Goal Achievement: 08/16/19 Potential to Achieve Goals: Good    Frequency 7X/week    AM-PAC PT "6 Clicks" Mobility  Outcome Measure Help needed turning from your back to your side while in a flat bed without using bedrails?: A Little Help needed moving from lying on your back to sitting on the side of a flat bed without using bedrails?: A Little Help needed moving to and from a bed to a chair (including a wheelchair)?: A Little Help needed standing up from a chair using your arms (e.g., wheelchair or bedside chair)?: A Little Help needed to walk in hospital room?: A Little Help needed climbing 3-5 steps with a railing? : A Little 6 Click Score: 18    End of Session Equipment Utilized During Treatment: Gait belt Activity Tolerance: Patient tolerated treatment well Patient left: with call bell/phone within reach;with family/visitor present;in chair;with chair alarm set Nurse Communication:  Mobility status PT Visit Diagnosis: Muscle weakness (generalized) (M62.81);Difficulty in walking, not elsewhere classified (R26.2)    Time: 5956-3875 PT Time Calculation (min) (ACUTE ONLY): 46 min   Charges:   PT Evaluation $PT Eval Low Complexity: 1 Low PT Treatments $Self Care/Home Management: 8-22        Valentino Saxon, PT, DPT Physical Therapist with [facility]  08/09/2019 6:48 PM

## 2019-08-10 ENCOUNTER — Encounter (HOSPITAL_COMMUNITY): Payer: Self-pay | Admitting: Orthopedic Surgery

## 2019-08-10 LAB — BASIC METABOLIC PANEL
Anion gap: 8 (ref 5–15)
BUN: 14 mg/dL (ref 6–20)
CO2: 25 mmol/L (ref 22–32)
Calcium: 9 mg/dL (ref 8.9–10.3)
Chloride: 102 mmol/L (ref 98–111)
Creatinine, Ser: 0.87 mg/dL (ref 0.61–1.24)
GFR calc Af Amer: >60 mL/min (ref >60)
GFR calc non Af Amer: >60 mL/min (ref >60)
Glucose, Bld: 136 mg/dL — ABNORMAL HIGH (ref 70–99)
Potassium: 4.3 mmol/L (ref 3.5–5.1)
Sodium: 135 mmol/L (ref 135–145)

## 2019-08-10 LAB — CBC
HCT: 36.9 % — ABNORMAL LOW (ref 39.0–52.0)
Hemoglobin: 12.6 g/dL — ABNORMAL LOW (ref 13.0–17.0)
MCH: 31.3 pg (ref 26.0–34.0)
MCHC: 34.1 g/dL (ref 30.0–36.0)
MCV: 91.8 fL (ref 80.0–100.0)
Platelets: 148 10*3/uL — ABNORMAL LOW (ref 150–400)
RBC: 4.02 MIL/uL — ABNORMAL LOW (ref 4.22–5.81)
RDW: 11.8 % (ref 11.5–15.5)
WBC: 14.2 10*3/uL — ABNORMAL HIGH (ref 4.0–10.5)
nRBC: 0 % (ref 0.0–0.2)

## 2019-08-10 MED ORDER — POLYETHYLENE GLYCOL 3350 17 G PO PACK: 17.0000 g | PACK | Freq: Two times a day (BID) | ORAL | 0 refills | Status: AC

## 2019-08-10 MED ORDER — HYDROCODONE-ACETAMINOPHEN 5-325 MG PO TABS: 1.0000 | ORAL_TABLET | Freq: Four times a day (QID) | ORAL | 0 refills | Status: AC | PRN

## 2019-08-10 MED ORDER — DOCUSATE SODIUM 100 MG PO CAPS: 100.0000 mg | ORAL_CAPSULE | Freq: Two times a day (BID) | ORAL | 0 refills | Status: AC

## 2019-08-10 MED ORDER — METHOCARBAMOL 500 MG PO TABS: 500.0000 mg | ORAL_TABLET | Freq: Four times a day (QID) | ORAL | 0 refills | Status: AC | PRN

## 2019-08-10 MED ORDER — ASPIRIN 81 MG PO CHEW: 81.0000 mg | CHEWABLE_TABLET | Freq: Two times a day (BID) | ORAL | 0 refills | Status: AC

## 2019-08-10 MED ORDER — FERROUS SULFATE 325 (65 FE) MG PO TABS: 325.0000 mg | ORAL_TABLET | Freq: Three times a day (TID) | ORAL | 0 refills | Status: AC

## 2019-08-10 NOTE — Progress Notes (Signed)
Physical Therapy Treatment Patient Details Name: Joe Moody MRN: [id number] DOB: October 13, 1961 Today's Date: 08/10/2019    History of Present Illness Patient is 58 y.o. male s/p Lt THR (revision) on 08/09/19 with PMH significnat for bil GERD and OA with bil hip replacements and multiple revisions on Lt.    PT Comments    Pt progressing well; reviewed THP with handout; reviewed HEP handout; discussed THP with regard to car transfers. Ready for d/c from PT standpoint.   Follow Up Recommendations  Outpatient PT;Follow surgeon's recommendation for DC plan and follow-up therapies     Equipment Recommendations  None recommended by PT    Recommendations for Other Services       Precautions / Restrictions Precautions Precautions: Fall;Posterior Hip Restrictions Weight Bearing Restrictions: No    Mobility  Bed Mobility Overal bed mobility: Modified Independent                Transfers Overall transfer level: Needs assistance Equipment used: Rolling walker (2 wheeled) Transfers: Sit to/from Stand Sit to Stand: Supervision;Modified independent (Device/Increase time)         General transfer comment: cues for THP  Ambulation/Gait Ambulation/Gait assistance: Supervision Gait Distance (Feet): 380 Feet Assistive device: Rolling walker (2 wheeled) Gait Pattern/deviations: Step-through pattern     General Gait Details: cues for progression, THP with turns   Stairs             Wheelchair Mobility    Modified Rankin (Stroke Patients Only)       Balance                                            Cognition Arousal/Alertness: Awake/alert Behavior During Therapy: WFL for tasks assessed/performed Overall Cognitive Status: Within Functional Limits for tasks assessed                                        Exercises Total Joint Exercises Ankle Circles/Pumps: AROM;10 reps;Seated;Both Long Arc Quad: AROM;Both;10 reps Knee  Flexion: AROM;Left;10 reps( reviewed HEP handout with pt)    General Comments        Pertinent Vitals/Pain Pain Assessment: 0-10 Pain Score: 1  Pain Location: Lt hip Pain Descriptors / Indicators: Aching;Sore Pain Intervention(s): Limited activity within patient's tolerance;Monitored during session;Premedicated before session;Repositioned    Home Living                      Prior Function            PT Goals (current goals can now be found in the care plan section) Acute Rehab PT Goals Patient Stated Goal: to get back to exercising with machines at home, to not have hip dislocate again PT Goal Formulation: With patient Time For Goal Achievement: 08/16/19 Potential to Achieve Goals: Good Progress towards PT goals: Progressing toward goals    Frequency    7X/week      PT Plan Current plan remains appropriate    Co-evaluation              AM-PAC PT "6 Clicks" Mobility   Outcome Measure  Help needed turning from your back to your side while in a flat bed without using bedrails?: A Little Help needed moving from lying on your back to sitting on the  side of a flat bed without using bedrails?: None Help needed moving to and from a bed to a chair (including a wheelchair)?: None Help needed standing up from a chair using your arms (e.g., wheelchair or bedside chair)?: None Help needed to walk in hospital room?: None Help needed climbing 3-5 steps with a railing? : A Little 6 Click Score: 22    End of Session   Activity Tolerance: Patient tolerated treatment well Patient left: in chair;with call bell/phone within reach;with family/visitor present Nurse Communication: Mobility status PT Visit Diagnosis: Muscle weakness (generalized) (M62.81);Difficulty in walking, not elsewhere classified (R26.2)     Time: 2979-8921 PT Time Calculation (min) (ACUTE ONLY): 24 min  Charges:  $Gait Training: 23-37 mins                     Kenyon Ana, PT  Pager:  [phone] Acute Rehab Dept [facility]): [phone]   08/10/2019    [facility] 08/10/2019, 10:36 AM

## 2019-08-10 NOTE — Plan of Care (Signed)
Patient discharged home in stable condition 

## 2019-08-10 NOTE — Brief Op Note (Signed)
08/09/2019  1:50PM  PATIENT:  Joe Moody  58 y.o. 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   Delay start of Pharmacological VTE agent (>24hrs) due to surgical blood loss or risk of bleeding: no

## 2019-08-10 NOTE — Progress Notes (Signed)
     Subjective: 1 Day Post-Op Procedure(s) (LRB): TOTAL HIP REVISION (Left)   Seen by Dr. Alvan Dame. Patient reports pain as mild, pain controlled.  No events throughout the night.  The procedure, findings and expectations moving forward per reviewed with the patient.  Posterior hip precautions. Patient to be discharged home today, if he does well with therapy and pain control.  Patient follow-up in the clinic in 2 weeks.  Patient is to call with any questions or concerns.   Objective:   VITALS:   Vitals:   08/10/19 0151 08/10/19 0553  BP: 104/61 109/68  Pulse: 71 69  Resp: 16 18  Temp: 98 F (36.7 C) 98.1 F (36.7 C)  SpO2: 99% 100%    Dorsiflexion/Plantar flexion intact Incision: dressing C/D/I No cellulitis present Compartment soft  LABS Recent Labs    08/10/19 0259  HGB 12.6*  HCT 36.9*  WBC 14.2*  PLT 148*    Recent Labs    08/10/19 0259  NA 135  K 4.3  BUN 14  CREATININE 0.87  GLUCOSE 136*     Assessment/Plan: 1 Day Post-Op Procedure(s) (LRB): TOTAL HIP REVISION (Left) Posterior hip precautions Foley cath d/c'ed Advance diet Up with therapy D/C IV fluids Discharge home Follow up in 2 weeks at [facility] Follow up with OLIN,Daja Shuping D in 2 weeks.  Contact information:  [facility] [address] [place]a   PAC  08/10/2019, 8:18 AM

## 2019-08-10 NOTE — Op Note (Signed)
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  PROCEDURE:  Revision left total hip replacement.  COMPONENTS USED:  DePuy size 60 mm Pinnacle shell with a 36+4 neutral ALTRX liner and a 36+15 Articul/Eze metal ball.  SURGEON:  Durene Romans, MD  ASSISTANT:  Lanney Gins, PA-C.  Note that Mr. Carmon Sails was present for the entirety of the case from preoperative positioning, perioperative management of the operative extremity, general facilitation of the case and primary wound closure.  ANESTHESIA:  Spinal.  BLOOD LOSS:  700 mL.  DRAINS:  None.  COMPLICATIONS:  None.  INDICATIONS FOR PROCEDURE:  The patient is a pleasant 58 year old male with history of left total hip replacement.  In fact, he had both hips replaced in the past.  He has had 2 episodes of instability or dislocation of the left hip.  The most recent one  required an open reduction.  He was seen in referral evaluation for management of the left hip.  In the office, we discussed observation with the idea of continued observation and conservative management.  He did not want to get through what he went  through before and decided to proceed with elective revision left total hip.  We reviewed the risks of recurrent and early dislocation following this type of surgery, the risk of infection, DVT, and need for future surgeries.  Consent was obtained for  benefit of pain relief as well as provide more security of the left lower extremity.  PROCEDURE IN DETAIL:  The patient was brought to the operative theater.  Once adequate anesthesia, preoperative antibiotics, and Ancef administered, he was positioned into the  right lateral decubitus position, left hip up.  The left lower extremity was  then prepped and draped in sterile fashion for posterior approach.  A timeout was performed identifying the patient, the planned procedure, and extremity.  His old incision had been marked.  I excised his old scar.  Soft tissue planes were created.   Posterior approach to the hip was carried out through the iliotibial band and gluteal fascia.  We encountered a mildly blood-tinged synovial fluid.  No signs of infection.  The posterior aspect of the hip had the appearance of the previous surgical  intervention with scarring of the synovium and surrounding scar tissue.  The posterior aspect of the hip was directly exposed with no overlying soft tissues.  With routine exposure of the posterior aspect of the hip carried out, I dislocated the femoral  head, and we removed the femoral head using a bone tamp.  I cleared off the superior ilium and placed the neck and trunnion onto the ilium and retracted the femur anteriorly.  This allowed for further exposure of the anterior acetabulum.  I evaluated the  position of the previously placed cup.  It appeared to be positioned at about 45 degrees of abduction, but maybe 10 degrees of anteversion.  The anterior rim of the acetabulum was not palpable.  Given these findings, we elected to proceed with primary  intent of an acetabular revision.  I used the Innomed explant system and was able to remove the acetabular shell.  There was evidence of very good bony ingrowth.  This took some effort; however, the cup was removed without significant bone  loss.  Once  this was carried out and the retractors placed for exposure, we irrigated the acetabulum and reamed with a 57 mm reamer as we removed a 58 mm cup.  I then reamed with a 59 mm and used a 60 mm cup.  I was able to place this cup with better anteversion  with the anterior rim of the acetabulum exposed, as well as posterior aspect of the  acetabular shell exposed.  Utilizing the same guide that I utilized for evaluation of this, I felt like the abduction was around 140 degrees and the forward flexion was  now about 25-30 degrees.  Given these findings, I placed 2 cancellous screws in the ilium and a trial 36+4 liner.  Then we did trial reductions.  The femoral head that had been removed was 36+8.5 mm.  For that reason, I tried the 12 mm and then the 15 mm  ball.  I found with the 15 mm ball, the combined anteversion of the hip and stem now were about 45-50 degrees, whereas it was much less than it was prior.  The leg lengths appeared to be improved with a 15 mm ball as compared to the 8.5 and 12 ball.   Given these findings, I selected a 36+15 Articul/Eze metal ball.  It was impacted on a clean and dried trunnion and the hip was reduced.  The hip was irrigated throughout the case and again at this point.  Again, there were no posterior tissues to  reapproximate at this point.  I reapproximated the iliotibial band and gluteal fascia using #1 Vicryl and a Stratafix suture.  The remaining wound was closed with 2-0 Vicryl and a running Monocryl.  The hip was cleaned, dried, and dressed sterilely using  surgical glue and an Aquacel dressing.  He was brought to the recovery room in stable condition, tolerating the procedure well.  LN/NUANCE  D:08/10/2019 T:08/10/2019 JOB:[id number]

## 2019-08-10 NOTE — Anesthesia Postprocedure Evaluation (Signed)
Anesthesia Post Note  Patient: Joe Moody  Procedure(s) Performed: TOTAL HIP REVISION (Left Hip)     Patient location during evaluation: PACU Anesthesia Type: Spinal Level of consciousness: oriented and awake and alert Pain management: pain level controlled Vital Signs Assessment: post-procedure vital signs reviewed and stable Respiratory status: spontaneous breathing and respiratory function stable Cardiovascular status: blood pressure returned to baseline and stable Postop Assessment: no headache, no backache and no apparent nausea or vomiting Anesthetic complications: no    Last Vitals:  Vitals:   08/10/19 0151 08/10/19 0553  BP: 104/61 109/68  Pulse: 71 69  Resp: 16 18  Temp: 36.7 C 36.7 C  SpO2: 99% 100%    Last Pain:  Vitals:   08/10/19 0600  TempSrc:   PainSc: 2                  Lynda Rainwater

## 2019-08-14 NOTE — Discharge Summary (Signed)
Physician Discharge Summary  Patient ID: Joe Moody MRN: [id number] DOB/AGE: January 12, 1961 58 y.o.  Admit date: 08/09/2019 Discharge date: 08/10/2019   Procedures:  Procedure(s) (LRB): TOTAL HIP REVISION (Left)  Attending Physician:  Dr. 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 Arthritis   . GERD (gastroesophageal reflux disease)   . History of kidney stones   . Status post dilation of esophageal narrowing   . Ulcerative colitis [facility](HCC)     HPI:    Joe Moody, 58 y.o. male, has a history of pain and functional disability in the left hip due to dislocations and patient has failed non-surgical conservative treatments for greater than 12 weeks to include NSAID's and/or analgesics, use of assistive devices and activity modification. The indications for the revision total hip arthroplasty are instability with 3 dislocations. Onset of symptoms was abrupt starting 3 years ago with stable course since that time.  Prior procedures on the left hip include arthroplasty and and subsquent revision. Patient currently rates pain in the left hip at 10 out of 10 with dislocations, otherwise no significant pain.  Patient has evidence of previous TH by imaging studies.  This condition presents safety issues increasing the risk of falls.   There is no current active infection.  Risks, benefits and expectations were discussed with the patient.  Risks including but not limited to the risk of anesthesia, blood clots, nerve damage, blood vessel damage, failure of the prosthesis, infection and up to and including death.  Patient understand the risks, benefits and expectations and wishes to proceed with surgery.   PCP: Nicholaus Bloomrisp, Laddie Moore, MD   Discharged Condition: good  Hospital Course:  Patient underwent the above stated procedure on 08/09/2019.  Patient tolerated the procedure well and brought to the recovery room in good condition and subsequently to the floor.  POD #1 BP: 109/68 ; Pulse: 69 ; Temp: 98.1 F (36.7 C) ; Resp: 18 Patient reports pain as mild, pain controlled.  No events throughout the night.  The procedure, findings and expectations moving forward per reviewed with the patient.  Posterior hip precautions. Patient to be discharged home. Dorsiflexion/plantar flexion intact, incision: dressing C/D/I, no cellulitis present and compartment soft.   LABS  Basename    HGB     12.6  HCT     36.9    Discharge Exam: General appearance: alert, cooperative and no distress Extremities: Homans sign is negative, no sign of DVT, no edema, redness or tenderness in the calves or thighs and no ulcers, gangrene or trophic changes  Disposition:  Home with follow up in 2 weeks   Follow-up Information    Durene Romanslin, Trease Bremner, MD. Schedule an appointment as soon as possible for a visit in 2 weeks.   Specialty: Orthopedic Surgery Contact information: [address] [place] [id number] [phone]           Discharge Instructions    Call MD / Call 911   Complete by: As directed    If you experience chest pain or shortness of breath, CALL 911 and be transported to the hospital emergency room.  If you develope a fever above 101 F, pus (white drainage) or increased drainage or redness at the wound, or calf pain, call your surgeon's office.   Change dressing   Complete by: As directed  Maintain surgical dressing until follow up in the clinic. If the edges start to pull up, may reinforce with tape. If the dressing is no longer working, may remove and cover with gauze and tape, but must keep the area dry and clean.  Call with any questions or concerns.   Constipation Prevention   Complete by: As directed    Drink plenty of fluids.  Prune juice may be helpful.  You may use a stool softener, such as Colace (over the  counter) 100 mg twice a day.  Use MiraLax (over the counter) for constipation as needed.   Diet - low sodium heart healthy   Complete by: As directed    Discharge instructions   Complete by: As directed    Maintain surgical dressing until follow up in the clinic. If the edges start to pull up, may reinforce with tape. If the dressing is no longer working, may remove and cover with gauze and tape, but must keep the area dry and clean.  Follow up in 2 weeks at [facility]. Call with any questions or concerns.   Follow the hip precautions as taught in Physical Therapy   Complete by: As directed    Increase activity slowly as tolerated   Complete by: As directed    Weight bearing as tolerated with assist device (walker, cane, etc) as directed, use it as long as suggested by your surgeon or therapist, typically at least 4-6 weeks.   TED hose   Complete by: As directed    Use stockings (TED hose) for 2 weeks on both leg(s).  You may remove them at night for sleeping.      Allergies as of 08/10/2019   No Known Allergies     Medication List    STOP taking these medications   mesalamine 1.2 g EC tablet Commonly known as: LIALDA     TAKE these medications   Align 4 MG Caps Take 1 capsule by mouth daily.   aspirin 81 MG chewable tablet Commonly known as: Aspirin Childrens Chew 1 tablet (81 mg total) by mouth 2 (two) times daily. Take for 4 weeks, then resume regular dose.   celecoxib 200 MG capsule Commonly known as: CELEBREX Take 200 mg by mouth every evening.   docusate sodium 100 MG capsule Commonly known as: Colace Take 1 capsule (100 mg total) by mouth 2 (two) times daily.   esomeprazole 40 MG capsule Commonly known as: NEXIUM Take 40 mg by mouth every evening.   ferrous sulfate 325 (65 FE) MG tablet Commonly known as: FerrouSul Take 1 tablet (325 mg total) by mouth 3 (three) times daily with meals for 14 days.   finasteride 5 MG tablet Commonly known as:  PROSCAR Take 5 mg by mouth every evening.   HYDROcodone-acetaminophen 5-325 MG tablet Commonly known as: Norco Take 1-2 tablets by mouth every 6 (six) hours as needed for moderate pain or severe pain.   methocarbamol 500 MG tablet Commonly known as: Robaxin Take 1 tablet (500 mg total) by mouth every 6 (six) hours as needed for muscle spasms.   polyethylene glycol 17 g packet Commonly known as: MIRALAX / GLYCOLAX Take 17 g by mouth 2 (two) times daily.            Discharge Care Instructions  (From admission, onward)         Start     Ordered   08/10/19 0000  Change dressing    Comments: Maintain surgical dressing until follow  up in the clinic. If the edges start to pull up, may reinforce with tape. If the dressing is no longer working, may remove and cover with gauze and tape, but must keep the area dry and clean.  Call with any questions or concerns.   08/10/19 [id number]           Signed: Anastasio Auerbach. Danyale Ridinger   PA-C  08/14/2019, 10:10 AM

## 2020-05-01 IMAGING — DX DG PORTABLE PELVIS
1 series · 1 of 1 positions shown · non-contrast
Comparison: None.

CLINICAL DATA: S/p left anterior hip replacement

EXAM:
PORTABLE PELVIS 1-2 VIEWS

[pelvis ap]
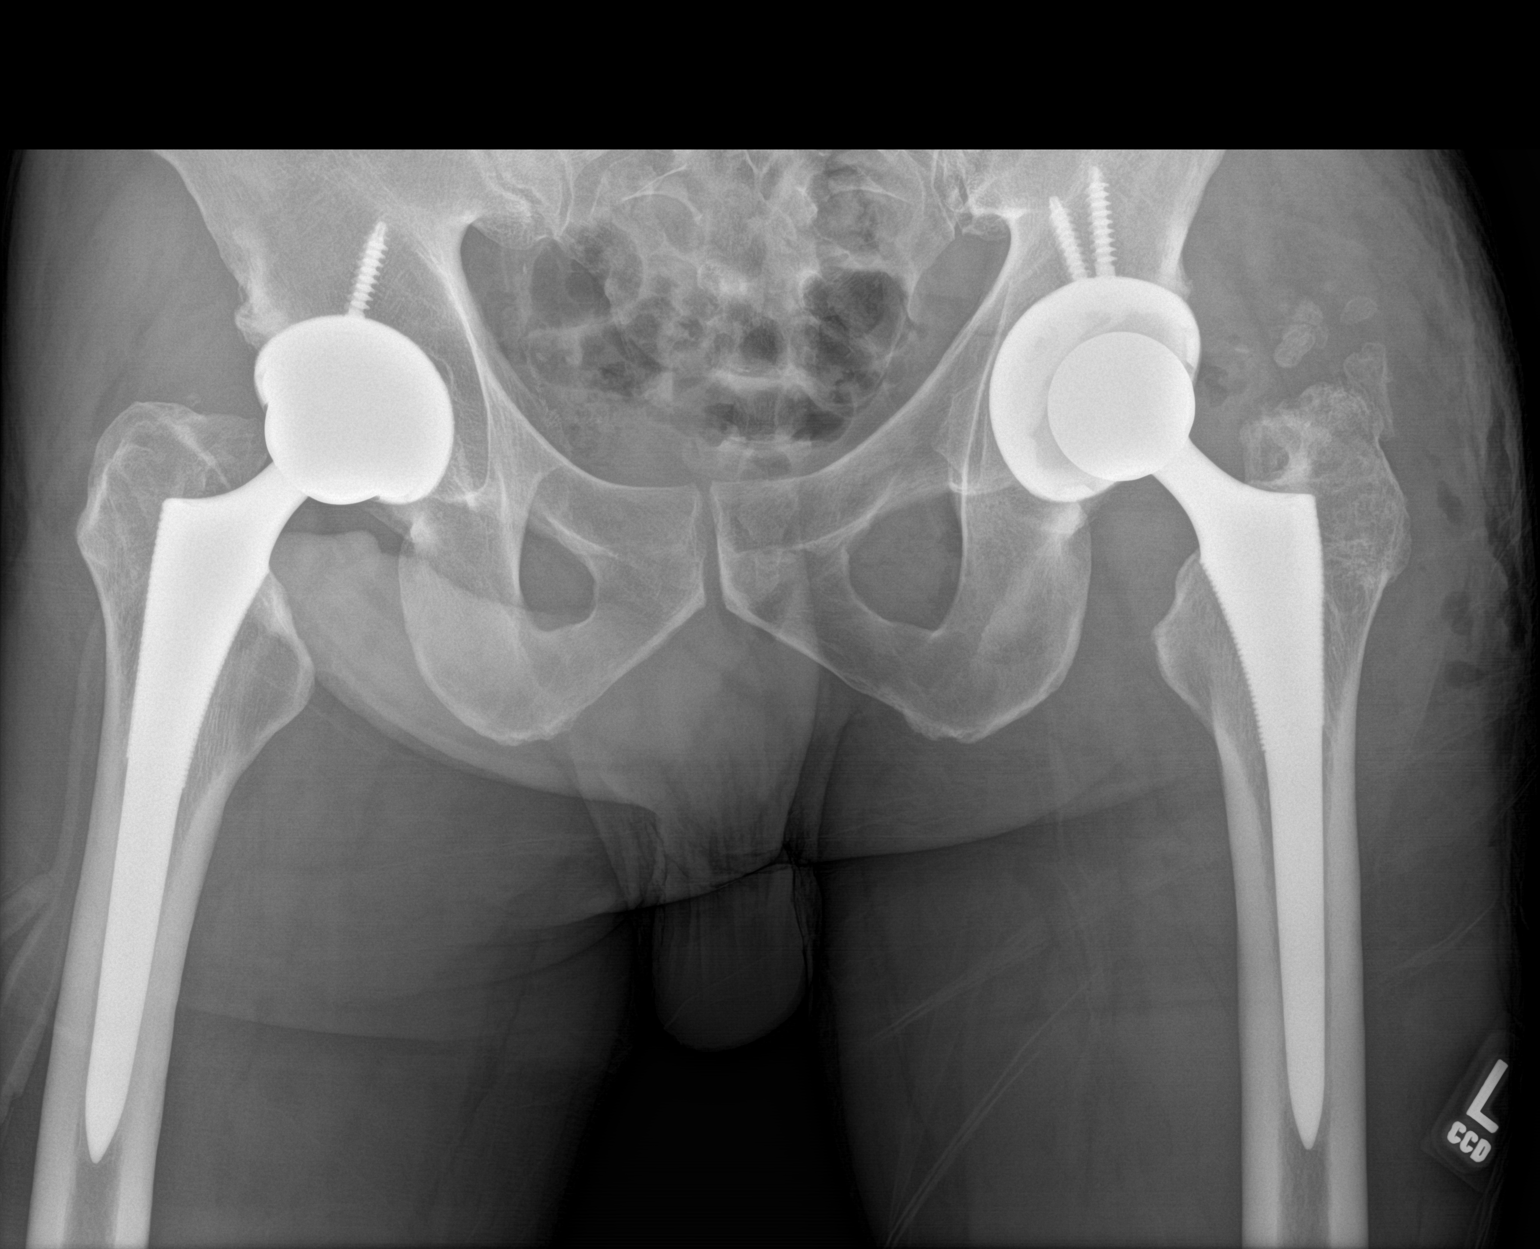

[1 of 1 positions shown; findings below may reference images not displayed]

FINDINGS: Patient has had bilateral total hip arthroplasty. Hardware appears
intact. No evidence for dislocation. Soft tissue gas is identified
overlying the LEFT hip, consistent with recent surgery.
IMPRESSION: Status post bilateral total hip arthroplasty. No adverse features.
